# Patient Record
Sex: Female | Born: 1970 | Hispanic: No | Marital: Married | Smoking: Never smoker
Health system: Southern US, Community
[De-identification: ages and names within clinical notes are randomized; demographics above are authoritative.]

## PROBLEM LIST (undated history)

## (undated) DIAGNOSIS — F419 Anxiety disorder, unspecified: Secondary | ICD-10-CM

---

## 1998-05-20 ENCOUNTER — Emergency Department (HOSPITAL_COMMUNITY): Admission: EM | Admit: 1998-05-20 | Discharge: 1998-05-20 | Payer: Self-pay | Admitting: Emergency Medicine

## 1998-12-28 ENCOUNTER — Emergency Department (HOSPITAL_COMMUNITY): Admission: EM | Admit: 1998-12-28 | Discharge: 1998-12-28 | Payer: Self-pay | Admitting: Emergency Medicine

## 2000-04-25 ENCOUNTER — Emergency Department (HOSPITAL_COMMUNITY): Admission: EM | Admit: 2000-04-25 | Discharge: 2000-04-25 | Payer: Self-pay | Admitting: Emergency Medicine

## 2001-07-19 ENCOUNTER — Encounter: Payer: Self-pay | Admitting: Obstetrics and Gynecology

## 2001-07-19 ENCOUNTER — Ambulatory Visit (HOSPITAL_COMMUNITY): Admission: RE | Admit: 2001-07-19 | Discharge: 2001-07-19 | Payer: Self-pay | Admitting: Obstetrics and Gynecology

## 2002-09-15 ENCOUNTER — Other Ambulatory Visit: Admission: RE | Admit: 2002-09-15 | Discharge: 2002-09-15 | Payer: Self-pay | Admitting: Obstetrics and Gynecology

## 2003-09-21 ENCOUNTER — Other Ambulatory Visit: Admission: RE | Admit: 2003-09-21 | Discharge: 2003-09-21 | Payer: Self-pay | Admitting: Obstetrics and Gynecology

## 2004-09-27 ENCOUNTER — Other Ambulatory Visit: Admission: RE | Admit: 2004-09-27 | Discharge: 2004-09-27 | Payer: Self-pay | Admitting: Obstetrics and Gynecology

## 2005-10-02 ENCOUNTER — Other Ambulatory Visit: Admission: RE | Admit: 2005-10-02 | Discharge: 2005-10-02 | Payer: Self-pay | Admitting: Obstetrics and Gynecology

## 2006-10-15 ENCOUNTER — Encounter: Admission: RE | Admit: 2006-10-15 | Discharge: 2006-10-15 | Payer: Self-pay | Admitting: Obstetrics and Gynecology

## 2007-11-11 ENCOUNTER — Encounter: Admission: RE | Admit: 2007-11-11 | Discharge: 2007-11-11 | Payer: Self-pay | Admitting: Obstetrics and Gynecology

## 2009-06-23 ENCOUNTER — Encounter: Admission: RE | Admit: 2009-06-23 | Discharge: 2009-06-23 | Payer: Self-pay | Admitting: Obstetrics and Gynecology

## 2011-04-20 ENCOUNTER — Other Ambulatory Visit: Payer: Self-pay | Admitting: Obstetrics and Gynecology

## 2011-04-20 DIAGNOSIS — Z1231 Encounter for screening mammogram for malignant neoplasm of breast: Secondary | ICD-10-CM

## 2011-05-02 ENCOUNTER — Ambulatory Visit
Admission: RE | Admit: 2011-05-02 | Discharge: 2011-05-02 | Disposition: A | Discharge status: Home / Self Care | Payer: BC Managed Care – PPO | Source: Ambulatory Visit | Attending: Obstetrics and Gynecology | Admitting: Obstetrics and Gynecology

## 2011-05-02 DIAGNOSIS — Z1231 Encounter for screening mammogram for malignant neoplasm of breast: Secondary | ICD-10-CM

## 2012-07-08 ENCOUNTER — Emergency Department (HOSPITAL_COMMUNITY): Payer: Self-pay

## 2012-07-08 ENCOUNTER — Emergency Department (HOSPITAL_COMMUNITY)
Admission: EM | Admit: 2012-07-08 | Discharge: 2012-07-08 | Disposition: A | Discharge status: Home / Self Care | Payer: Self-pay | Attending: Emergency Medicine | Admitting: Emergency Medicine

## 2012-07-08 ENCOUNTER — Encounter (HOSPITAL_COMMUNITY): Payer: Self-pay | Admitting: Emergency Medicine

## 2012-07-08 DIAGNOSIS — F1520 Other stimulant dependence, uncomplicated: Secondary | ICD-10-CM | POA: Insufficient documentation

## 2012-07-08 DIAGNOSIS — R569 Unspecified convulsions: Secondary | ICD-10-CM | POA: Insufficient documentation

## 2012-07-08 DIAGNOSIS — F13239 Sedative, hypnotic or anxiolytic dependence with withdrawal, unspecified: Secondary | ICD-10-CM

## 2012-07-08 DIAGNOSIS — F19939 Other psychoactive substance use, unspecified with withdrawal, unspecified: Secondary | ICD-10-CM | POA: Insufficient documentation

## 2012-07-08 LAB — BASIC METABOLIC PANEL
BUN: 9 mg/dL (ref 6–23)
CO2: 27 mEq/L (ref 19–32)
Chloride: 98 mEq/L (ref 96–112)
Creatinine, Ser: 0.57 mg/dL (ref 0.50–1.10)
GFR calc Af Amer: >90 mL/min (ref >90)
Glucose, Bld: 172 mg/dL — ABNORMAL HIGH (ref 70–99)

## 2012-07-08 LAB — CBC WITH DIFFERENTIAL/PLATELET
Basophils Relative: 0 % (ref 0–1)
HCT: 40 % (ref 36.0–46.0)
Hemoglobin: 14 g/dL (ref 12.0–15.0)
Lymphs Abs: 0.8 10*3/uL (ref 0.7–4.0)
MCHC: 35 g/dL (ref 30.0–36.0)
Monocytes Absolute: 0.2 10*3/uL (ref 0.1–1.0)
Monocytes Relative: 3 % (ref 3–12)
Neutro Abs: 7.8 10*3/uL — ABNORMAL HIGH (ref 1.7–7.7)

## 2012-07-08 MED ORDER — LORAZEPAM 2 MG/ML IJ SOLN
1.0000 mg | Freq: Once | INTRAMUSCULAR | Status: AC
  Administered 2012-07-08: 1 mg via INTRAVENOUS
  Filled 2012-07-08: qty 1

## 2012-07-08 MED ORDER — LORAZEPAM 1 MG PO TABS: 1.0000 mg | ORAL_TABLET | Freq: Three times a day (TID) | ORAL | Status: AC | PRN

## 2012-07-08 NOTE — ED Notes (Signed)
Patient brought in via [facility]  With complaint of a seizure activity. This was witnessed by her husband, lasted approximately 1 minute and 30 seconds.No history of seizures per EMS was advised. Alert and oriented at this time.

## 2012-07-08 NOTE — ED Notes (Signed)
The patient is AOx4 and comfortable with her discharge instructions.  The patient's spouse is present to drive her home.

## 2012-07-08 NOTE — ED Notes (Signed)
Patient brings in an empty bottle of Xanax 1 mg empty she advises that she is out and feels like the reason she had a seizure was because she has not been able to take the Xanax in 3 days ago. She also states that she gave some to her friend. Patient had a prescription filled on 06/15/2012 for 120 tablets. She is suppose to take 1 tablet QID.

## 2012-07-08 NOTE — ED Provider Notes (Signed)
History     CSN: [id number]  Arrival date & time 07/08/12  1754   First MD Initiated Contact with Patient 07/08/12 1820      Chief Complaint  Patient presents with  . Seizures    Patient is a 41 year old female with past medical history of anxiety/depression who presents after seizure. Husband reports that he witnessed seizure activity, described as patient's eyes with radiation into the back of her head, foaming at the mouth, and bilateral upper and lower extremity jerking.  He reports that approximately 10 minutes of confusion after event. Patient states that she has been unable to take her Xanax for the last 3 days. When asked why she states that she gave her Xanax to a friend that was in need. She denies any prior history of seizures, and denies any other symptoms.  Currently, patient and husband state that she is at her baseline mental status.   (Consider location/radiation/quality/duration/timing/severity/associated sxs/prior treatment) Patient is a 41 y.o. female presenting with seizures. The history is provided by the patient. No language interpreter was used.  Seizures  This is a new problem. The current episode started 6 to 12 hours ago. The problem has been resolved. There was 1 seizure. Characteristics include eye deviation and rhythmic jerking. The episode was witnessed. There was no sensation of an aura present. The seizures did not continue in the ED. The seizure(s) had no focality. There has been no fever. There were no medications administered prior to arrival.    History reviewed. No pertinent past medical history.  History reviewed. No pertinent past surgical history.  No family history on file.  History  Substance Use Topics  . Smoking status: Never Smoker   . Smokeless tobacco: Not on file  . Alcohol Use: No    OB History    Grav Para Term Preterm Abortions TAB SAB Ect Mult Living                  Review of Systems  Neurological: Positive for seizures.    All other systems reviewed and are negative.    Allergies  Codeine  Home Medications   Current Outpatient Rx  Name Route Sig Dispense Refill  . ALPRAZOLAM 1 MG PO TABS Oral Take 1 mg by mouth 3 (three) times daily as needed. For anxiety    . BUPROPION HCL ER (XL) 150 MG PO TB24 Oral Take 150 mg by mouth daily.    Marland Kitchen CETIRIZINE-PSEUDOEPHEDRINE ER 5-120 MG PO TB12 Oral Take 1 tablet by mouth 2 (two) times daily.    Marland Kitchen FLUOXETINE HCL 20 MG PO CAPS Oral Take 20 mg by mouth daily.      BP 131/99  Pulse 103  Temp 97.9 F (36.6 C) (Oral)  Resp 12  SpO2 100%  LMP 07/01/2012  Physical Exam  Nursing note and vitals reviewed. Constitutional: She is oriented to person, place, and time. She appears well-developed and well-nourished.  HENT:  Head: Normocephalic and atraumatic.  Right Ear: External ear normal.  Left Ear: External ear normal.  Nose: Nose normal.  Mouth/Throat: Oropharynx is clear and moist.  Eyes: Conjunctivae and EOM are normal. Pupils are equal, round, and reactive to light.  Neck: Normal range of motion. Neck supple.  Cardiovascular: Normal rate, regular rhythm and intact distal pulses.  Exam reveals no gallop and no friction rub.   No murmur heard. Pulmonary/Chest: Effort normal and breath sounds normal.  Abdominal: Soft. Bowel sounds are normal.  Musculoskeletal: Normal range of motion.  She exhibits no edema and no tenderness.  Neurological: She is alert and oriented to person, place, and time. She has normal reflexes.  Skin: Skin is warm and dry.  Psychiatric: She has a normal mood and affect.    ED Course  Procedures (including critical care time)  Labs Reviewed  CBC WITH DIFFERENTIAL - Abnormal; Notable for the following:    Neutrophils Relative 88 (*)     Neutro Abs 7.8 (*)     Lymphocytes Relative 9 (*)     All other components within normal limits  BASIC METABOLIC PANEL - Abnormal; Notable for the following:    Sodium 134 (*)     Glucose, Bld 172  (*)     All other components within normal limits   Ct Head Wo Contrast  07/08/2012  *RADIOLOGY REPORT*  Clinical Data: 40 year old female with seizure activity.  No seizure history.  CT HEAD WITHOUT CONTRAST  Technique:  Contiguous axial images were obtained from the base of the skull through the vertex without contrast.  Comparison: None.  Findings: Visualized paranasal sinuses and mastoids are clear. Leftward nasal septal deviation and spurring. No acute osseous abnormality identified.  Visualized orbits and scalp soft tissues are within normal limits.  No ventriculomegaly. No midline shift, mass effect, or evidence of mass lesion.  No acute intracranial hemorrhage identified.  No evidence of cortically based acute infarction identified.  Wallace Cullens- white matter differentiation is within normal limits throughout the brain.  No suspicious intracranial vascular hyperdensity.  IMPRESSION: Normal noncontrast CT appearance of the brain.   Original Report Authenticated By: Harley Hallmark, M.D.      1. Seizure   2. Benzodiazepine withdrawal       MDM   Patient is a 41 year old female who presents after witnessed seizure after stopping xanax abruptly 3 days ago. In the emergency department patient was noted to be afebrile and vital signs unremarkable. On exam patient with no neurological deficits,  no focal signs of infection, and otherwise as above. Due to inability to rule out organic causes, it was felt that basic labs and head CT were warranted. Review of results showed no acute findings. Clinical presentation consistent with benzodiazepine withdrawal. Patient was given IV Ativan in the emergency department, given a short prescription for the same, and discharged with instructions to follow up with psychiatrist as soon as possible.  No seizure activity noted in the ED.           Johnney Ou, MD 07/09/12 [phone]

## 2012-07-08 NOTE — ED Notes (Signed)
Report to Damon RN

## 2012-07-09 NOTE — ED Provider Notes (Signed)
I saw and evaluated the patient, reviewed the resident's note and I agree with the findings and plan.   Date: 07/09/2012  Rate: 104  Rhythm: normal sinus rhythm  QRS Axis: normal  Intervals: normal  ST/T Wave abnormalities: normal  Conduction Disutrbances: none  Narrative Interpretation:   Old EKG Reviewed: No prior ecg  Pt is well appearing, likely benzo withdrawal. Short course of ativan. Dc home at this time with pcp follow up     Lyanne Co, MD 07/09/12 [phone]

## 2012-11-20 ENCOUNTER — Other Ambulatory Visit: Payer: Self-pay | Admitting: Obstetrics and Gynecology

## 2012-11-20 DIAGNOSIS — Z1231 Encounter for screening mammogram for malignant neoplasm of breast: Secondary | ICD-10-CM

## 2012-12-17 ENCOUNTER — Ambulatory Visit
Admission: RE | Admit: 2012-12-17 | Discharge: 2012-12-17 | Disposition: A | Discharge status: Home / Self Care | Payer: BC Managed Care – PPO | Source: Ambulatory Visit | Attending: Obstetrics and Gynecology | Admitting: Obstetrics and Gynecology

## 2012-12-17 DIAGNOSIS — Z1231 Encounter for screening mammogram for malignant neoplasm of breast: Secondary | ICD-10-CM

## 2014-03-26 ENCOUNTER — Other Ambulatory Visit: Payer: Self-pay

## 2014-03-26 DIAGNOSIS — Z1231 Encounter for screening mammogram for malignant neoplasm of breast: Secondary | ICD-10-CM

## 2014-03-31 ENCOUNTER — Ambulatory Visit: Payer: BC Managed Care – PPO

## 2014-04-02 ENCOUNTER — Encounter (INDEPENDENT_AMBULATORY_CARE_PROVIDER_SITE_OTHER): Payer: Self-pay

## 2014-04-02 ENCOUNTER — Ambulatory Visit
Admission: RE | Admit: 2014-04-02 | Discharge: 2014-04-02 | Disposition: A | Discharge status: Home / Self Care | Payer: BC Managed Care – PPO | Source: Ambulatory Visit

## 2014-04-02 DIAGNOSIS — Z1231 Encounter for screening mammogram for malignant neoplasm of breast: Secondary | ICD-10-CM

## 2014-10-04 IMAGING — MG MM DIGITAL SCREENING BILAT
3 series · 3 of 3 positions shown · non-contrast
Comparison: Previous exam(s).

CLINICAL DATA: Screening.

EXAM:
DIGITAL SCREENING BILATERAL MAMMOGRAM WITH CAD

[L CC]
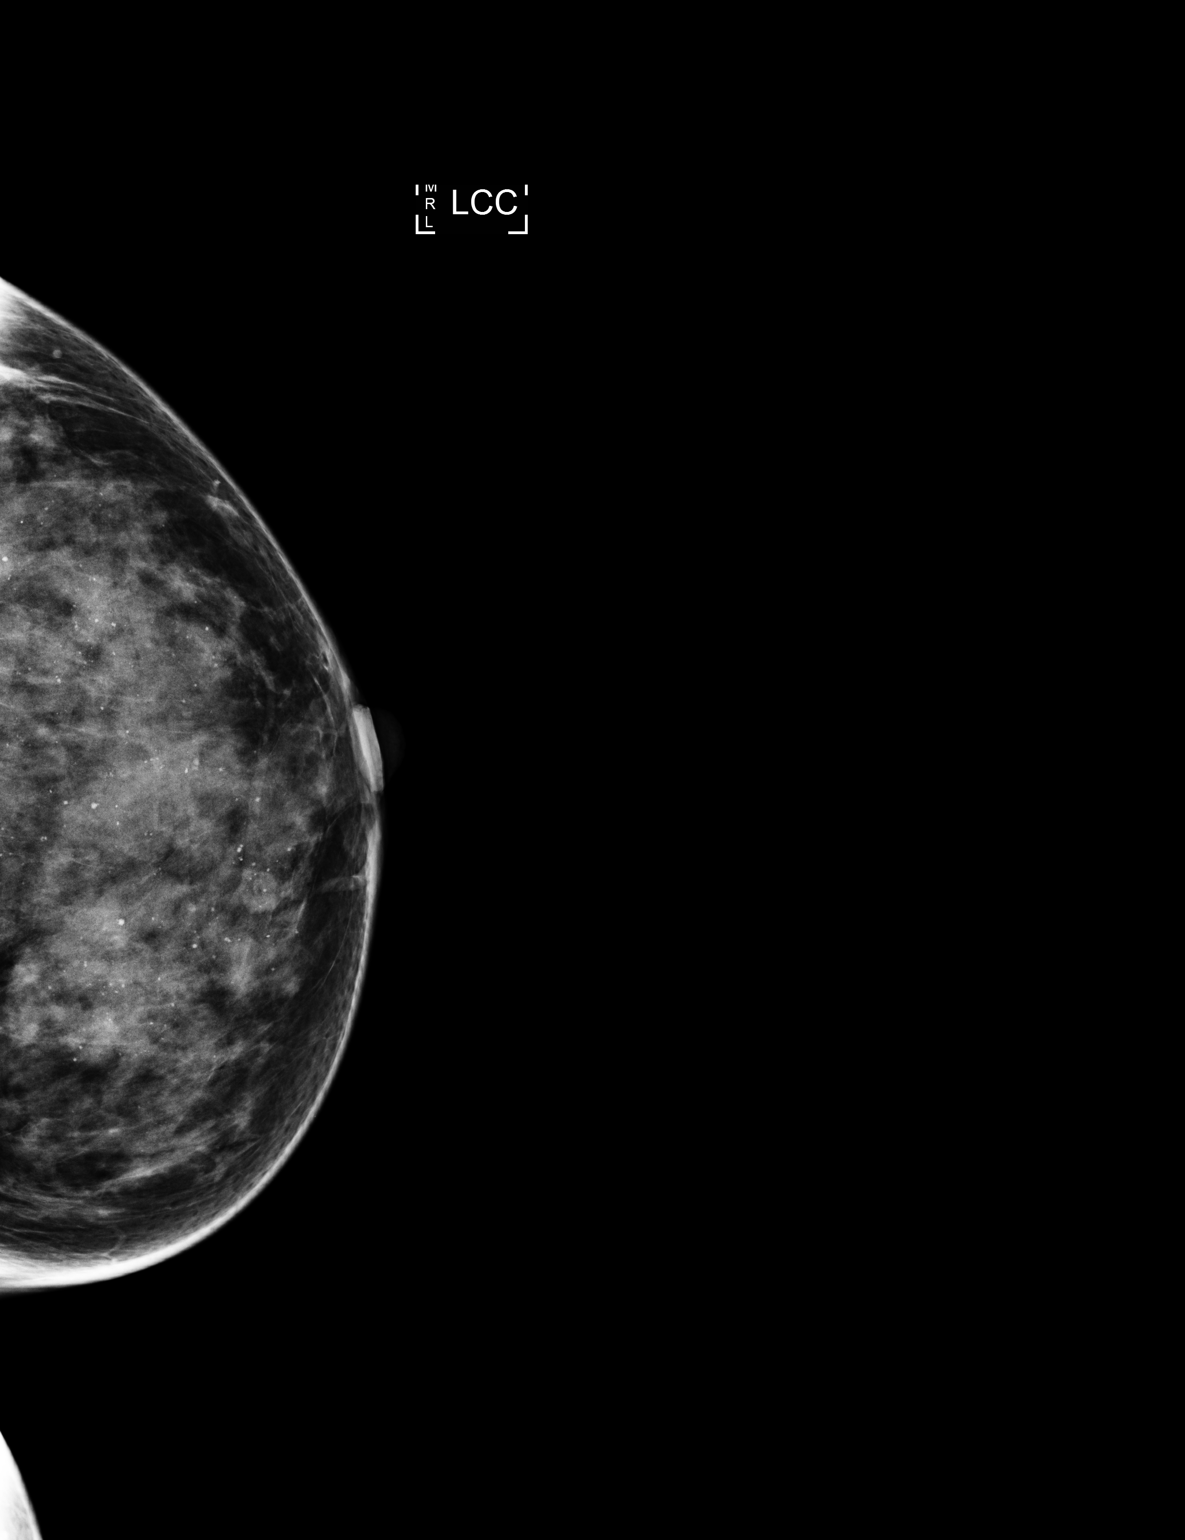

[R MLO]
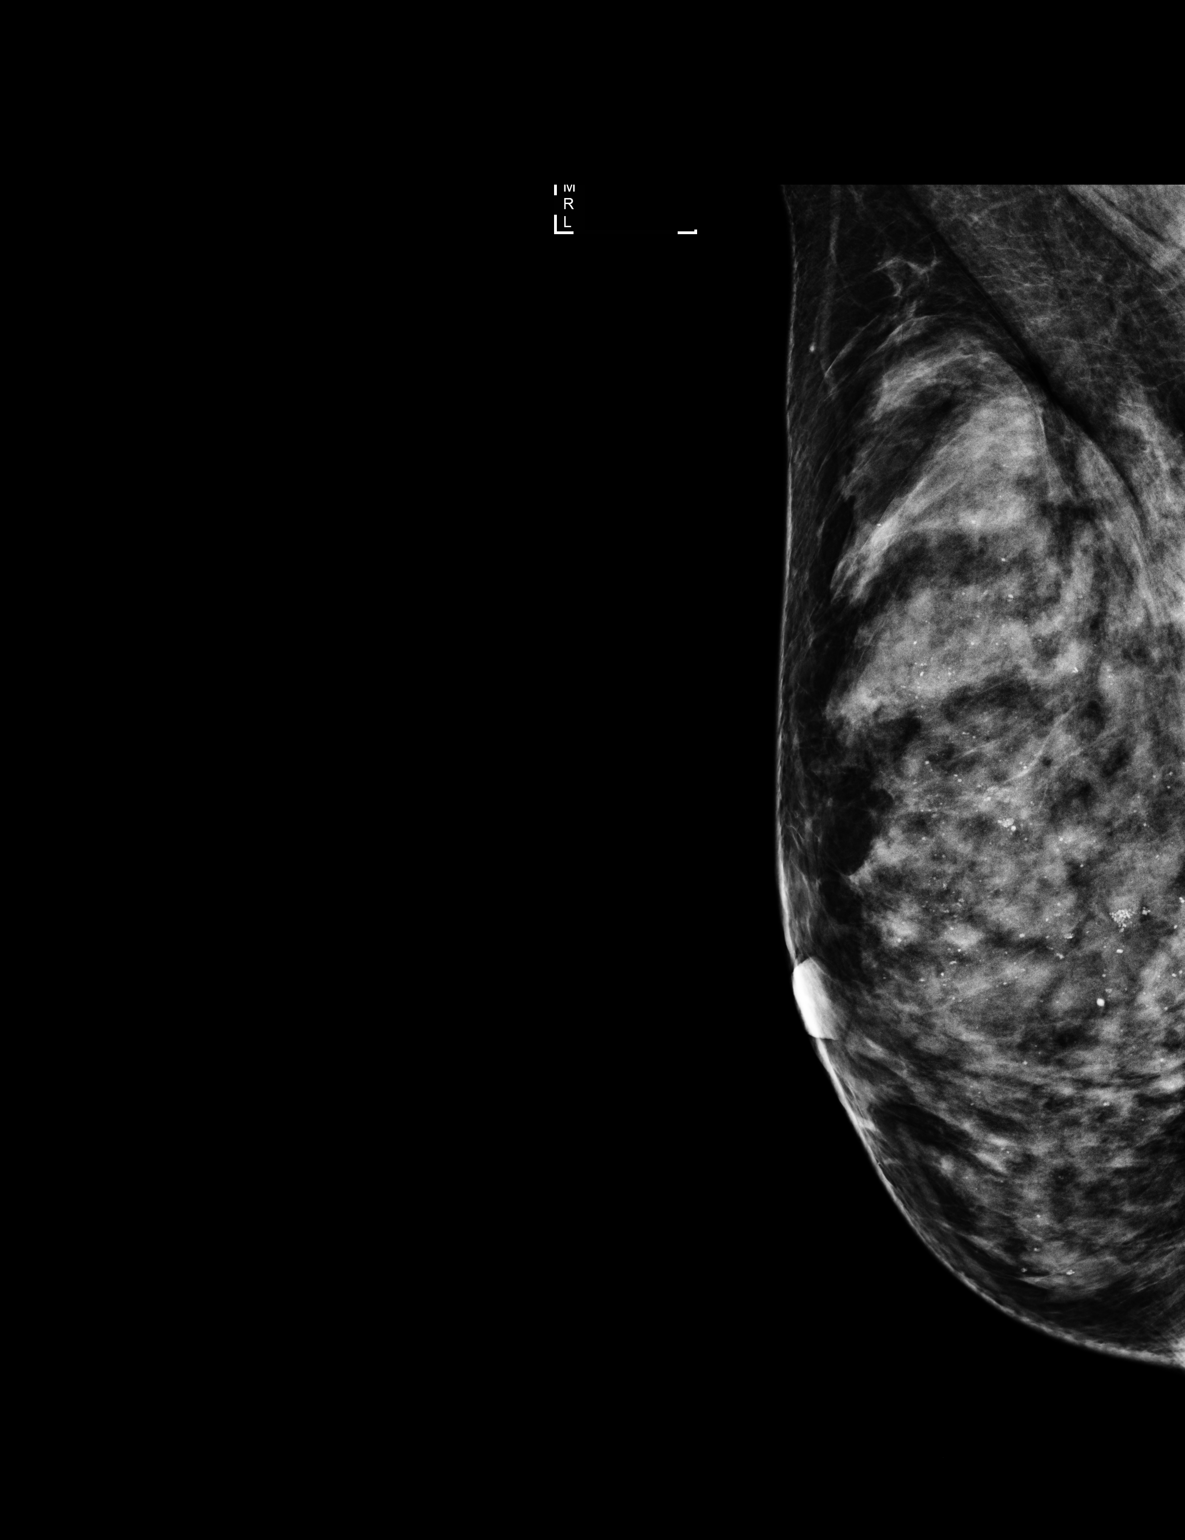

[R CC]
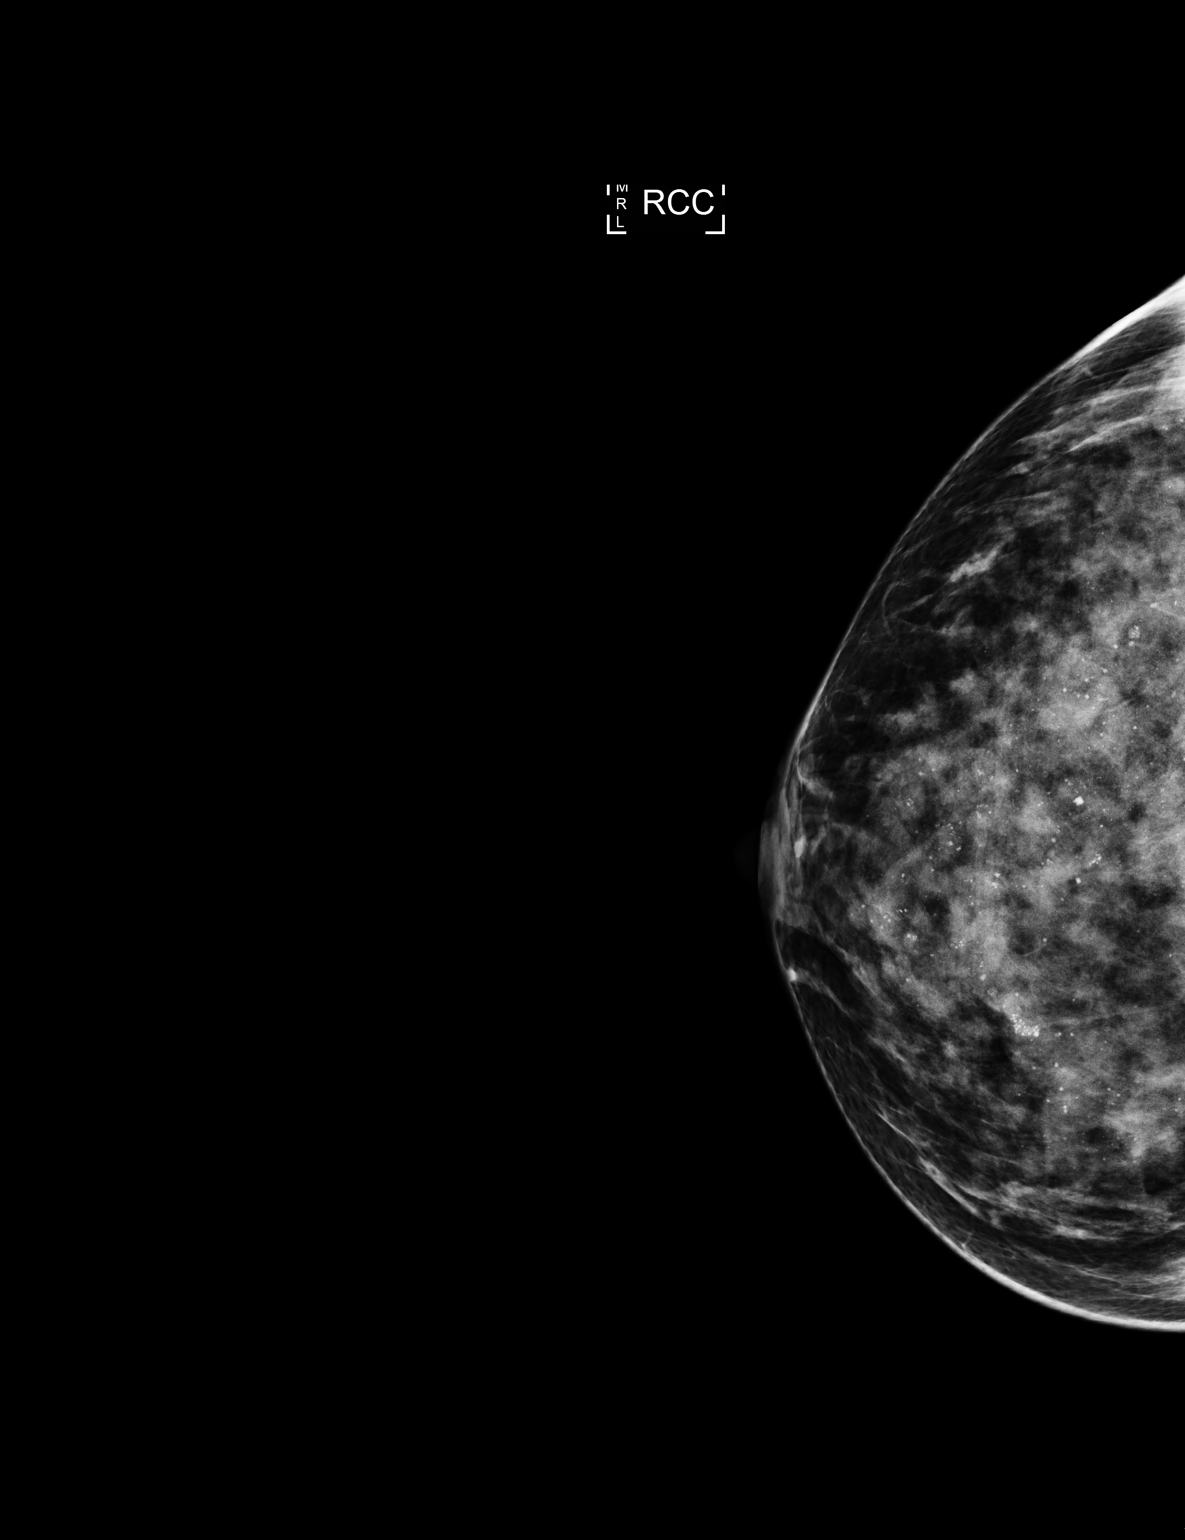

[3 of 3 positions shown; findings below may reference images not displayed]

ACR Breast Density Category d: The breast tissue is extremely dense,
which lowers the sensitivity of mammography.
FINDINGS: There are no findings suspicious for malignancy. Images were
processed with CAD.
IMPRESSION: No mammographic evidence of malignancy. A result letter of this
screening mammogram will be mailed directly to the patient.

RECOMMENDATION:
Screening mammogram in one year. (Code:[id number]-D-K0F)

BI-RADS CATEGORY  1: Negative.

## 2021-01-17 ENCOUNTER — Other Ambulatory Visit: Payer: Self-pay | Admitting: Family Medicine

## 2021-11-24 ENCOUNTER — Other Ambulatory Visit: Payer: Self-pay

## 2021-11-24 ENCOUNTER — Encounter (HOSPITAL_COMMUNITY): Payer: Self-pay | Admitting: Emergency Medicine

## 2021-11-24 ENCOUNTER — Emergency Department (HOSPITAL_COMMUNITY)
Admission: EM | Admit: 2021-11-24 | Discharge: 2021-11-24 | Disposition: A | Discharge status: Home / Self Care | Payer: BC Managed Care – PPO | Attending: Emergency Medicine | Admitting: Emergency Medicine

## 2021-11-24 DIAGNOSIS — F13930 Sedative, hypnotic or anxiolytic use, unspecified with withdrawal, uncomplicated: Secondary | ICD-10-CM | POA: Insufficient documentation

## 2021-11-24 DIAGNOSIS — R251 Tremor, unspecified: Secondary | ICD-10-CM | POA: Insufficient documentation

## 2021-11-24 DIAGNOSIS — F419 Anxiety disorder, unspecified: Secondary | ICD-10-CM | POA: Diagnosis not present

## 2021-11-24 HISTORY — DX: Anxiety disorder, unspecified: F41.9

## 2021-11-24 LAB — BASIC METABOLIC PANEL
Anion gap: 8 (ref 5–15)
BUN: 18 mg/dL (ref 6–20)
CO2: 26 mmol/L (ref 22–32)
Calcium: 9.4 mg/dL (ref 8.9–10.3)
Chloride: 103 mmol/L (ref 98–111)
Creatinine, Ser: 0.63 mg/dL (ref 0.44–1.00)
GFR, Estimated: >60 mL/min (ref >60)
Glucose, Bld: 117 mg/dL — ABNORMAL HIGH (ref 70–99)
Potassium: 4.2 mmol/L (ref 3.5–5.1)
Sodium: 137 mmol/L (ref 135–145)

## 2021-11-24 LAB — CBC WITH DIFFERENTIAL/PLATELET
Abs Immature Granulocytes: 0.01 10*3/uL (ref 0.00–0.07)
Basophils Absolute: 0 10*3/uL (ref 0.0–0.1)
Basophils Relative: 1 %
Eosinophils Absolute: 0.1 10*3/uL (ref 0.0–0.5)
Eosinophils Relative: 3 %
HCT: 40.1 % (ref 36.0–46.0)
Hemoglobin: 13.6 g/dL (ref 12.0–15.0)
Immature Granulocytes: 0 %
Lymphocytes Relative: 37 %
Lymphs Abs: 1.9 10*3/uL (ref 0.7–4.0)
MCH: 30.6 pg (ref 26.0–34.0)
MCHC: 33.9 g/dL (ref 30.0–36.0)
MCV: 90.3 fL (ref 80.0–100.0)
Monocytes Absolute: 0.4 10*3/uL (ref 0.1–1.0)
Monocytes Relative: 8 %
Neutro Abs: 2.7 10*3/uL (ref 1.7–7.7)
Neutrophils Relative %: 51 %
Platelets: 258 10*3/uL (ref 150–400)
RBC: 4.44 MIL/uL (ref 3.87–5.11)
RDW: 12.8 % (ref 11.5–15.5)
WBC: 5.3 10*3/uL (ref 4.0–10.5)
nRBC: 0 % (ref 0.0–0.2)

## 2021-11-24 LAB — ACETAMINOPHEN LEVEL: Acetaminophen (Tylenol), Serum: <10 ug/mL — ABNORMAL LOW (ref 10–30)

## 2021-11-24 LAB — URINALYSIS, ROUTINE W REFLEX MICROSCOPIC
Bilirubin Urine: NEGATIVE
Glucose, UA: NEGATIVE mg/dL
Hgb urine dipstick: NEGATIVE
Ketones, ur: NEGATIVE mg/dL
Leukocytes,Ua: NEGATIVE
Nitrite: NEGATIVE
Protein, ur: NEGATIVE mg/dL
Specific Gravity, Urine: 1.003 — ABNORMAL LOW (ref 1.005–1.030)
pH: 6 (ref 5.0–8.0)

## 2021-11-24 LAB — RAPID URINE DRUG SCREEN, HOSP PERFORMED
Amphetamines: NOT DETECTED
Barbiturates: NOT DETECTED
Benzodiazepines: POSITIVE — AB
Cocaine: NOT DETECTED
Opiates: NOT DETECTED
Tetrahydrocannabinol: NOT DETECTED

## 2021-11-24 LAB — SALICYLATE LEVEL: Salicylate Lvl: <7 mg/dL — ABNORMAL LOW (ref 7.0–30.0)

## 2021-11-24 MED ORDER — LORAZEPAM 1 MG PO TABS
1.0000 mg | ORAL_TABLET | Freq: Once | ORAL | Status: AC
  Administered 2021-11-24: 1 mg via ORAL
  Filled 2021-11-24: qty 1

## 2021-11-24 NOTE — Progress Notes (Signed)
.  Transition of Care [facility]) - Emergency Department Mini Assessment   Patient Details  Name: Danielle Roy MRN: [id number] Date of Birth: Aug 14, 1971  [facility] CM/SW Contact:    Larrie Kass, LCSW Phone Number: 11/24/2021, 1:24 PM   Clinical Narrative: TOC CSW spoke with admission from [facility] , they reported pt can come as a walk in to be assessed for Admission. CSW attached resource to pt's AVS.      ED Mini Assessment: What brought you to the Emergency Department? : withdrawing from xanax  Barriers to Discharge: No Barriers Identified        Interventions which prevented an admission or readmission: Follow-up medical appointment    Patient Contact and Communications        ,                 Admission diagnosis:  withdrawal from xanax There are no problems to display for this patient.  PCP:  System, Provider Not In Pharmacy:   [id number] - [place], [place] - [address] [address] Phone: [phone] Fax: [phone]

## 2021-11-24 NOTE — ED Provider Notes (Signed)
[facility]   CSN: [id number] Arrival date & time: 11/24/21  [id number]     History  Chief Complaint  Patient presents with   Withdrawal    Danielle Roy is a 51 y.o. female.  With no significant past medical history who presents to the emergency department complaining of Xanax withdrawal.   Patient states that for many years now she has been taking Xanax.  She states that this was originally prescribed by her psychiatrist.  She states she was originally taking them as prescribed however over the years began taking them more than prescribed and has been buying Xanax off of friends or on the street.  She states that she takes her prescribed Xanax and buys "blue footballs."  She states that "she will take around 3 doses of Xanax on a good day "but can take up to 6 or 7 tablets a day."  She states that she told her psychiatrist that she was labs yesterday.  Last Xanax use yesterday around noon.  She complains of tremors as well as anxiety.  She denies SI/HI/AVH.  She denies any chest pain, shortness of breath, abdominal pain, nausea or vomiting, diaphoresis, tactile hallucinations.  She states that she has withdrawn from Xanax previously and has had withdrawal seizures before.  Denies alcohol use, tobacco use or other drug use.  HPI     Home Medications Prior to Admission medications   Medication Sig Start Date End Date Taking? Authorizing Provider  ALPRAZolam Prudy Feeler(XANAX) 1 MG tablet Take 1 mg by mouth 3 (three) times daily as needed. For anxiety    [provider]  buPROPion (WELLBUTRIN XL) 150 MG 24 hr tablet Take 150 mg by mouth daily.    [provider]  cetirizine-pseudoephedrine (ZYRTEC-D) 5-120 MG per tablet Take 1 tablet by mouth 2 (two) times daily.    [provider]  FLUoxetine (PROZAC) 20 MG capsule Take 20 mg by mouth daily.    [provider]      Allergies    Codeine    Review of Systems    Review of Systems  Constitutional:  Negative for diaphoresis and fever.  Respiratory:  Negative for shortness of breath.   Cardiovascular:  Negative for chest pain.  Gastrointestinal:  Negative for abdominal pain, nausea and vomiting.  Neurological:  Negative for headaches.  Psychiatric/Behavioral:  Positive for behavioral problems. Negative for agitation, hallucinations, self-injury and suicidal ideas. The patient is nervous/anxious.    Physical Exam Updated Vital Signs BP (!) 153/103 (BP Location: Left Arm)    Pulse (!) 110    Temp (!) 97.4 F (36.3 C) (Oral)    Resp 18    Ht 5\' 4"  (1.626 m)    Wt 55.3 kg    LMP 11/29/2012    SpO2 98%    BMI 20.94 kg/m  Physical Exam Vitals and nursing note reviewed.  Constitutional:      General: She is not in acute distress.    Appearance: Normal appearance. She is normal weight. She is not ill-appearing, toxic-appearing or diaphoretic.  HENT:     Head: Normocephalic and atraumatic.     Nose: Nose normal.     Mouth/Throat:     Mouth: Mucous membranes are moist.     Pharynx: Oropharynx is clear.  Eyes:     General: No scleral icterus.    Extraocular Movements: Extraocular movements intact.     Pupils: Pupils are equal, round, and reactive to light.  Cardiovascular:  Rate and Rhythm: Normal rate and regular rhythm.     Pulses: Normal pulses.     Heart sounds: No murmur heard. Pulmonary:     Effort: Pulmonary effort is normal. No respiratory distress.     Breath sounds: Normal breath sounds.  Abdominal:     General: Bowel sounds are normal. There is no distension.     Palpations: Abdomen is soft.     Tenderness: There is no abdominal tenderness.  Musculoskeletal:        General: Normal range of motion.     Cervical back: Normal range of motion and neck supple.  Skin:    General: Skin is warm and dry.     Capillary Refill: Capillary refill takes less than 2 seconds.  Neurological:     General: No focal deficit present.     Mental  Status: She is alert and oriented to person, place, and time. Mental status is at baseline.     GCS: GCS eye subscore is 4. GCS verbal subscore is 5. GCS motor subscore is 6.     Motor: Tremor present.     Coordination: Coordination is intact.     Gait: Gait is intact.  Psychiatric:        Attention and Perception: Attention and perception normal. She does not perceive auditory or visual hallucinations.        Mood and Affect: Mood is anxious. Affect is labile and tearful.        Speech: Speech normal.        Behavior: Behavior is cooperative.        Thought Content: Thought content does not include homicidal or suicidal ideation.        Cognition and Memory: Cognition and memory normal.        Judgment: Judgment is impulsive.    ED Results / Procedures / Treatments   Labs (all labs ordered are listed, but only abnormal results are displayed) Labs Reviewed  BASIC METABOLIC PANEL - Abnormal; Notable for the following components:      Result Value   Glucose, Bld 117 (*)    All other components within normal limits  SALICYLATE LEVEL - Abnormal; Notable for the following components:   Salicylate Lvl <7.0 (*)    All other components within normal limits  ACETAMINOPHEN LEVEL - Abnormal; Notable for the following components:   Acetaminophen (Tylenol), Serum <10 (*)    All other components within normal limits  URINALYSIS, ROUTINE W REFLEX MICROSCOPIC - Abnormal; Notable for the following components:   Color, Urine COLORLESS (*)    Specific Gravity, Urine 1.003 (*)    All other components within normal limits  RAPID URINE DRUG SCREEN, HOSP PERFORMED - Abnormal; Notable for the following components:   Benzodiazepines POSITIVE (*)    All other components within normal limits  CBC WITH DIFFERENTIAL/PLATELET   EKG EKG Interpretation  Date/Time:  Thursday November 24 2021 11:03:40 EST Ventricular Rate:  97 PR Interval:  146 QRS Duration: 74 QT Interval:  383 QTC Calculation: 487 R  Axis:   59 Text Interpretation: Sinus rhythm Abnormal R-wave progression, early transition Borderline prolonged QT interval Confirmed by Alona Bene [phone]) on 11/24/2021 11:08:47 AM  Radiology No results found.  Procedures Procedures   Medications Ordered in ED Medications  LORazepam (ATIVAN) tablet 1 mg (1 mg Oral Given 11/24/21 1201)    ED Course/ Medical Decision Making/ A&P  Medical Decision Making Amount and/or Complexity of Data Reviewed Labs: ordered.  Risk Prescription drug management.  Patient presents to the ED with complaints of Xanax withdrawal. This involves an extensive number of treatment options, and is a complaint that carries with it a high risk of complications and morbidity.   Additional history obtained:  Additional history obtained from: husband External records from outside source obtained and reviewed including: previous primary care visits   EKG: Sinus rhythm, abnormal R wave progression, borderline prolonged QTC 483  Cardiac Monitoring: The patient was maintained on a cardiac monitor.  I personally viewed and interpreted the cardiac monitored which showed an underlying rhythm of: Sinus rhythm  Lab Results: I Ordered, reviewed, and interpreted labs. Pertinent results include: UDS positive for benzos, otherwise negative Acetaminophen negative, salicylates negative BMP within normal limits CBC within normal limits  Medications  I ordered medication including Ativan for withdrawal Reevaluation of the patient after medication shows that patient improved  Consultations: Social work  ED Course: 51 year old female who presents emergency department with benzodiazepine withdrawal.  She does appear somewhat anxious and tremulous however her CIWA score here is 7.  She is requesting help with detox.  Given her Xanax here for her mild withdrawal symptoms at this time.  She does not have more Xanax at home per patient so cannot  manage withdrawal acutely at home.  She states that her psychiatrist also has not been prescribing her Xanax after finding out that she has been abusing it.   Ordered basic work-up which has been unremarkable. Symptoms have not escalated over almost 4 hours of observation She was given a dose of Ativan orally for her symptoms I consulted social work, Trula OreChristina, who [facility] [address] [place] who state they have open beds today and will also accept her as a walk in. I personally called [facility], who did not answer but accept walk-ins until 3p (currently 1:25pm).  Discussed with the patient at bedside options to [facility].  She is requesting prescription for Ativan.  I discussed with her that we will not be providing her with a prescription for Xanax given that we have to resources available today for her to go for detox.  She becomes somewhat agitated and tearful stating that we are not helping her. Husband has we presented to the emergency department also asking what the plan is.  I have discussed with him that there are 2 resources as described above that are available for them to go to immediately from the emergency department.  He verbalizes understanding.  Problem List:  Xanax withdrawal Benzodiazepine withdrawal does carry risk of withdrawal seizure however patient CIWA score relatively low at this time Given Ativan for symptomatic relief of symptoms Discussed extensively with social work who has provided 2 resources for her to go directly to from here today.  She has a ride with her husband to be able to go there from the emergency department. She had no escalation of her symptoms while she was here in the emergency department after 4 hours. Requires detox process over months given that she uses 6 to 7 mg of Xanax a day (estimated). No other concomitant ingestions She is alert and oriented and capable of making decisions at this time  Dispostion:  After  consideration of the diagnostic results and the patients response to treatment, I feel that the patent would benefit from discharge with presentation [facility] today.  Considered but do not  think necessary admission to inpatient medical treatment today.  As noted Xanax withdrawal does require supervised tapering as there is risk for withdrawal seizure.  I have discussed this with her.  I discussed that she has to facilities to present to today to be treated.  Discussed that we will not be providing prescription for Xanax at this time.  Discussed that there are pharmacies and professionals available at both facilities that she can present to for care.  She does not require admission at this time.  Discussed this patient with attending who agrees with the plan at this time.  Husband verbalizes that he will take patient to either [facility] or [facility] today for ongoing management of care.  Discussed that they can present back to the emergency department for any worsening concerns or symptoms.  Vitals are stable.  She is safe for discharge.  Final Clinical Impression(s) / ED Diagnoses Final diagnoses:  Benzodiazepine withdrawal without complication [facility])    Rx / DC Orders ED Discharge Orders     None         Cristopher Peru, PA-C 11/24/21 2106    Maia Plan, MD 11/30/21 2010

## 2021-11-24 NOTE — ED Triage Notes (Signed)
Patient sates she is withdrawing from xanax, last took one yesterday evening. Has been taking them for years. Pt states she feels like she is shaking inside, and not 'feeling great.' States she had been taking them 3-4x daily.

## 2021-11-24 NOTE — Discharge Instructions (Addendum)
Substance Abuse Treatment Programs ° °Intensive Outpatient Programs °[facility]     °[address]      °[place], [place]                   °[phone]      ° °[facility] °[address] °[place], [place] °[phone] ° °[facility]     °(Inpatient and outpatient)     °[address]           °[phone]   ° °[facility] °336-288-1484 (Suboxone and Methadone) ° °[address]      °[place], [place] [address]      °336-882-2125      ° °[address] °[place], [place] °852-3033 ° °[facility] (Outpatient/Inpatient, Chemical)    °(insurance only) [phone]      °       °[facility] (Groups & Residential) °[place], [place] °336-389-1413 ° °   °[facility]     °[address]     °[place], [place]      °336-389-1413      ° °[facility] (for caregivers and family) °[address] °[place], [place] °336-299-4655 ° ° ° ° ° °Residential Treatment Programs °[facility]      °[address], [place], [place] [address]  °([phone]      ° °[facility] °[address] [place], [place] [address] °[phone] ° °[facility]        °336-248-8914      ° °[facility] °1-800-659-3381 ° °[facility]             °[address]                                         °[place], [place]                                                °[phone] or 336-784-9470                              ° °[facility] [place] °[address] °[place], [address] °[phone] ° °[facility]    °[address]      °[place], [place]     °[phone]      ° °[facility] °[address] °[place], [place] °336-285-9073 ° °[facility]   °[address]     °[place], [place] [address]     °[phone]      °Admissions: 8am-3pm M-F ° °[facility] °[address] °[place],  [place] °336-227-7417 ° °[facility]: Residential Program (90 Days)   °[place], [place]      °336-725-8389 or 800-758-6077    ° °[facility] °[place], [place] °(Walk in Hours over the weekend or by referral) ° °[facility] °[address], [place],  [address] °([phone] ° °Crisis Mobile: [facility]:  1-877-626-1772 (for crisis response 24 hours a day) °[facility] Hotline:      1-800-256-2452 °Outpatient Psychiatry and Counseling ° °[facility]: Mobile Crisis   Management 24 hours:  1-877-626-1772 ° °[facility] sliding scale fee and walk in schedule: M-F 8am-12pm/1pm-3pm °[address]  °[place], [place] [address] °[phone] ° °[facility] °[address] °[place], [place] [address] °336-703-9650 ° °[facility] ([facility] new patient walk-in appointments available Monday - Friday 8am -3pm.          °[address] °[place], [place] [address] °336-676-6840 or crisis line- 336-676-6905 ° °[facility]/ Intensive Outpatient Therapy Program °[address] °[place], [place] [address] °336-832-9804 ° °[facility]                  °Crisis Services      °[phone]      °[address]     °[place], [place] [address]                ° °[facility]   °[facility] °[phone] °[address] °[place], [place] [address] ° ° °[facility]          °[address]  °[place], [place] [address]       °([phone] ° °[facility] °[address] °[place], [place] [address] °336-292-1510 ° °[facility]    °[address]    °[place], [place] [address]     °[phone]      ° °Parish McKinney, [place]     °[address]     °[place] [address]     °336-282-1251     °  °[facility] °[address] °[place] [address] ° °[facility]     °[address]     °[place], [place]      °336-542-2076      ° °[facility] °Shamsher Ahluwalia, MD °[address] °[place], [place] [address] °336-420-9558 ° °[facility]     °[address]     °[place], [place] [address]     °336-274-1237      ° °[facility] °[address] °[place], [place] [address] °336-854-4450 °Resources for Temporary Residential Assistance/Crisis Centers ° °DAY CENTERS °[facility] °M-F 8am-3pm   °[address] [place], [place] [address]   336-332-0824 °Services include: laundry, barbering, support groups, case management, phone  & computer access, showers, AA/NA mtgs, mental health/substance abuse nurse, job skills class, disability information, VA assistance, spiritual classes, etc.  ° °HOMELESS SHELTERS ° °[facility]     °[facility]   °[address], [place]     °[phone]       °       °[facility] (women and children)       °[address] °[place], [place] [address] °336-275-0820 °[email or web address] for application and process °Application Required ° °[facility]   °[address]    °[place] [address]     °[phone]       °             °[facility] °[address] °[place], [place] [address] °336.273.5572 °336-235-0363(schedule application appt.) °Application Required ° °[facility] (women only)    °[address]     °[place],  [address]     °336-884-1039      °  Intake starts 6pm daily °Need valid ID, SSC, & Police report °[facility] [place] °[address] °[place], [place] °336-881-5420 °Application Required ° °[facility] (men only)     °[address]      °[place], [place]     °[phone]      ° °[facility] °(Pregnant women only) °[address] °[place], [place] °336-275-0206 ° °[facility]      °[address]      °[place], [place] [address]     °[phone]      °       °[facility] °[address] °[place], [place] °336-723-1848 °90 day commitment/SA/Application process ° °[facility](men only)     °[address]     °[place], [place]     °[phone]       °Check-in at 7pm     °       °[facility] °[address] °[place], [place] [address] °336-248-6684 °Men/Women/Women and Children must be there by 7 pm ° °[facility] °[place], [place] °336-722-8721                ° °
